# Patient Record
Sex: Male | Born: 1985 | Race: White | Hispanic: No | Marital: Married | Smoking: Never smoker
Health system: Southern US, Community
[De-identification: ages and names within clinical notes are randomized; demographics above are authoritative.]

## PROBLEM LIST (undated history)

## (undated) DIAGNOSIS — T7840XA Allergy, unspecified, initial encounter: Secondary | ICD-10-CM

## (undated) DIAGNOSIS — J45909 Unspecified asthma, uncomplicated: Secondary | ICD-10-CM

## (undated) HISTORY — DX: Unspecified asthma, uncomplicated: J45.909

## (undated) HISTORY — DX: Allergy, unspecified, initial encounter: T78.40XA

---
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
## 2013-08-06 ENCOUNTER — Ambulatory Visit (INDEPENDENT_AMBULATORY_CARE_PROVIDER_SITE_OTHER): Payer: BC Managed Care – PPO | Admitting: Physician Assistant

## 2013-08-06 VITALS — BP 128/84 | HR 65 | Temp 97.5°F | Resp 16 | Ht 70.0 in | Wt 240.0 lb

## 2013-08-06 DIAGNOSIS — D649 Anemia, unspecified: Secondary | ICD-10-CM

## 2013-08-06 DIAGNOSIS — Z0000 Encounter for general adult medical examination without abnormal findings: Secondary | ICD-10-CM

## 2013-08-06 DIAGNOSIS — J309 Allergic rhinitis, unspecified: Secondary | ICD-10-CM

## 2013-08-06 DIAGNOSIS — Z23 Encounter for immunization: Secondary | ICD-10-CM

## 2013-08-06 LAB — POCT CBC
GRANULOCYTE PERCENT: 49 % (ref 37–80)
HCT, POC: 40.1 % — AB (ref 43.5–53.7)
Hemoglobin: 13 g/dL — AB (ref 14.1–18.1)
Lymph, poc: 2.2 (ref 0.6–3.4)
MCH, POC: 30.3 pg (ref 27–31.2)
MCHC: 32.4 g/dL (ref 31.8–35.4)
MCV: 93.5 fL (ref 80–97)
MID (cbc): 0.4 (ref 0–0.9)
MPV: 9.1 fL (ref 0–99.8)
POC GRANULOCYTE: 2.4 (ref 2–6.9)
POC LYMPH %: 43.1 % (ref 10–50)
POC MID %: 7.9 %M (ref 0–12)
Platelet Count, POC: 218 10*3/uL (ref 142–424)
RBC: 4.29 M/uL — AB (ref 4.69–6.13)
RDW, POC: 12.9 %
WBC: 5 10*3/uL (ref 4.6–10.2)

## 2013-08-06 LAB — POCT URINALYSIS DIPSTICK
Bilirubin, UA: NEGATIVE
Glucose, UA: NEGATIVE
Ketones, UA: NEGATIVE
Leukocytes, UA: NEGATIVE
NITRITE UA: NEGATIVE
PROTEIN UA: NEGATIVE
Spec Grav, UA: <=1.005
UROBILINOGEN UA: 0.2
pH, UA: 6.5

## 2013-08-06 LAB — POCT UA - MICROSCOPIC ONLY
BACTERIA, U MICROSCOPIC: NEGATIVE
CASTS, UR, LPF, POC: NEGATIVE
Crystals, Ur, HPF, POC: NEGATIVE
Mucus, UA: NEGATIVE
WBC, Ur, HPF, POC: NEGATIVE
YEAST UA: NEGATIVE

## 2013-08-06 MED ORDER — FLUTICASONE PROPIONATE 50 MCG/ACT NA SUSP: 2.0000 | Freq: Every day | NASAL | Status: DC

## 2013-08-06 MED ORDER — ALBUTEROL SULFATE HFA 108 (90 BASE) MCG/ACT IN AERS: 2.0000 | INHALATION_SPRAY | RESPIRATORY_TRACT | Status: DC | PRN

## 2013-08-06 NOTE — Patient Instructions (Signed)
I will contact you with your lab results as soon as they are available.   If you have not heard from me in 2 weeks, please contact me.  The fastest way to get your results is to register for My Chart (see the instructions on the last page of this printout).  Keeping you healthy  Get these tests  Blood pressure- Have your blood pressure checked once a year by your healthcare provider.  Normal blood pressure is 120/80.  Weight- Have your body mass index (BMI) calculated to screen for obesity.  BMI is a measure of body fat based on height and weight. You can also calculate your own BMI at www.nhlbisupport.com/bmi/.  Cholesterol- Have your cholesterol checked regularly starting at age 35, sooner may be necessary if you have diabetes, high blood pressure, if a family member developed heart diseases at an early age or if you smoke.   Chlamydia, HIV, and other sexual transmitted disease- Get screened each year until the age of 25 then within three months of each new sexual partner.  Diabetes- Have your blood sugar checked regularly if you have high blood pressure, high cholesterol, a family history of diabetes or if you are overweight.  Get these vaccines  Flu shot- Every fall.  Tetanus shot- Every 10 years.  Menactra- Single dose; prevents meningitis.  Take these steps  Don't smoke- If you do smoke, ask your healthcare provider about quitting. For tips on how to quit, go to www.smokefree.gov or call 1-800-QUIT-NOW.  Be physically active- Exercise 5 days a week for at least 30 minutes.  If you are not already physically active start slow and gradually work up to 30 minutes of moderate physical activity.  Examples of moderate activity include walking briskly, mowing the yard, dancing, swimming bicycling, etc.  Eat a healthy diet- Eat a variety of healthy foods such as fruits, vegetables, low fat milk, low fat cheese, yogurt, lean meats, poultry, fish, beans, tofu, etc.  For more information  on healthy eating, go to www.thenutritionsource.org  Drink alcohol in moderation- Limit alcohol intake two drinks or less a day.  Never drink and drive.  Dentist- Brush and floss teeth twice daily; visit your dentis twice a year.  Depression-Your emotional health is as important as your physical health.  If you're feeling down, losing interest in things you normally enjoy please talk with your healthcare provider.  Gun Safety- If you keep a gun in your home, keep it unloaded and with the safety lock on.  Bullets should be stored separately.  Helmet use- Always wear a helmet when riding a motorcycle, bicycle, rollerblading or skateboarding.  Safe sex- If you may be exposed to a sexually transmitted infection, use a condom  Seat belts- Seat bels can save your life; always wear one.  Smoke/Carbon Monoxide detectors- These detectors need to be installed on the appropriate level of your home.  Replace batteries at least once a year.  Skin Cancer- When out in the sun, cover up and use sunscreen SPF 15 or higher.  Violence- If anyone is threatening or hurting you, please tell your healthcare provider. 
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
## 2013-08-07 LAB — COMPREHENSIVE METABOLIC PANEL
ALBUMIN: 5 g/dL (ref 3.5–5.2)
ALT: 24 U/L (ref 0–53)
AST: 20 U/L (ref 0–37)
Alkaline Phosphatase: 65 U/L (ref 39–117)
BILIRUBIN TOTAL: 0.5 mg/dL (ref 0.2–1.2)
BUN: 10 mg/dL (ref 6–23)
CO2: 27 mEq/L (ref 19–32)
Calcium: 9.7 mg/dL (ref 8.4–10.5)
Chloride: 104 mEq/L (ref 96–112)
Creat: 0.93 mg/dL (ref 0.50–1.35)
Glucose, Bld: 84 mg/dL (ref 70–99)
POTASSIUM: 3.9 meq/L (ref 3.5–5.3)
SODIUM: 139 meq/L (ref 135–145)
Total Protein: 7.3 g/dL (ref 6.0–8.3)

## 2013-08-07 LAB — LIPID PANEL
Cholesterol: 203 mg/dL — ABNORMAL HIGH (ref 0–200)
HDL: 37 mg/dL — AB (ref >39)
LDL CALC: 142 mg/dL — AB (ref 0–99)
Total CHOL/HDL Ratio: 5.5 Ratio
Triglycerides: 118 mg/dL (ref <150)
VLDL: 24 mg/dL (ref 0–40)

## 2013-08-07 LAB — IRON AND TIBC
%SAT: 15 % — ABNORMAL LOW (ref 20–55)
Iron: 66 ug/dL (ref 42–165)
TIBC: 438 ug/dL — AB (ref 215–435)
UIBC: 372 ug/dL (ref 125–400)

## 2013-08-07 LAB — FERRITIN: FERRITIN: 16 ng/mL — AB (ref 22–322)

## 2013-08-07 LAB — TSH: TSH: 1.666 u[IU]/mL (ref 0.350–4.500)

## 2013-08-13 ENCOUNTER — Encounter: Payer: Self-pay | Admitting: Physician Assistant

## 2013-12-31 ENCOUNTER — Encounter: Payer: BC Managed Care – PPO | Admitting: Family Medicine

## 2014-01-15 NOTE — Progress Notes (Signed)
This encounter was created in error - please disregard.

## 2015-11-02 ENCOUNTER — Encounter (HOSPITAL_COMMUNITY): Payer: Self-pay | Admitting: *Deleted

## 2015-11-02 ENCOUNTER — Emergency Department (HOSPITAL_COMMUNITY): Payer: 59

## 2015-11-02 ENCOUNTER — Emergency Department (HOSPITAL_COMMUNITY)
Admission: EM | Admit: 2015-11-02 | Discharge: 2015-11-02 | Disposition: A | Discharge status: Home / Self Care | Payer: 59 | Attending: Emergency Medicine | Admitting: Emergency Medicine

## 2015-11-02 DIAGNOSIS — Z79899 Other long term (current) drug therapy: Secondary | ICD-10-CM | POA: Diagnosis not present

## 2015-11-02 DIAGNOSIS — W450XXA Nail entering through skin, initial encounter: Secondary | ICD-10-CM | POA: Diagnosis not present

## 2015-11-02 DIAGNOSIS — M79675 Pain in left toe(s): Secondary | ICD-10-CM | POA: Diagnosis present

## 2015-11-02 DIAGNOSIS — M795 Residual foreign body in soft tissue: Secondary | ICD-10-CM

## 2015-11-02 DIAGNOSIS — S90452A Superficial foreign body, left great toe, initial encounter: Secondary | ICD-10-CM | POA: Diagnosis not present

## 2015-11-02 DIAGNOSIS — Y939 Activity, unspecified: Secondary | ICD-10-CM | POA: Insufficient documentation

## 2015-11-02 DIAGNOSIS — Y999 Unspecified external cause status: Secondary | ICD-10-CM | POA: Diagnosis not present

## 2015-11-02 DIAGNOSIS — J45909 Unspecified asthma, uncomplicated: Secondary | ICD-10-CM | POA: Insufficient documentation

## 2015-11-02 DIAGNOSIS — Y929 Unspecified place or not applicable: Secondary | ICD-10-CM | POA: Insufficient documentation

## 2015-11-02 MED ORDER — LIDOCAINE HCL 2 % IJ SOLN
10.0000 mL | Freq: Once | INTRAMUSCULAR | Status: AC
  Administered 2015-11-02: 200 mg via INTRADERMAL
  Filled 2015-11-02: qty 20

## 2015-11-02 MED ORDER — BACITRACIN ZINC 500 UNIT/GM EX OINT
1.0000 "application " | TOPICAL_OINTMENT | Freq: Once | CUTANEOUS | Status: AC
  Administered 2015-11-02: 1 via TOPICAL
  Filled 2015-11-02: qty 0.9

## 2015-11-02 MED ORDER — ONDANSETRON 4 MG PO TBDP
4.0000 mg | ORAL_TABLET | Freq: Once | ORAL | Status: AC
  Administered 2015-11-02: 4 mg via ORAL
  Filled 2015-11-02: qty 1

## 2015-11-02 MED ORDER — CEPHALEXIN 500 MG PO CAPS
1000.0000 mg | ORAL_CAPSULE | Freq: Once | ORAL | Status: AC
  Administered 2015-11-02: 1000 mg via ORAL
  Filled 2015-11-02: qty 2

## 2015-11-02 MED ORDER — CEPHALEXIN 500 MG PO CAPS: 500.0000 mg | ORAL_CAPSULE | Freq: Four times a day (QID) | ORAL | Status: DC

## 2015-11-02 MED ORDER — HYDROCODONE-ACETAMINOPHEN 5-325 MG PO TABS: ORAL_TABLET | ORAL | Status: AC

## 2015-11-02 NOTE — Discharge Instructions (Signed)
Wash the affected area with soap and water and apply a thin layer of topical antibiotic ointment. Do this every 12 hours.  ° °Do not use rubbing alcohol or hydrogen peroxide.                       ° °Look for signs of infection: if you see redness, if the area becomes warm, if pain increases sharply, there is discharge (pus), if red streaks appear or you develop fever or vomiting, RETURN immediately to the Emergency Department  for a recheck.  ° °
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
## 2015-11-02 NOTE — ED Notes (Signed)
Approximately 1 hour PTA patient stepped on a screw in his garage while barefoot.  Patient now presents with 3 inch exterior screw through the side of his left 4th toe.  Bleeding is controlled, Tdap last given 2015.

## 2015-11-02 NOTE — ED Notes (Signed)
Assisted with obtaining supplies for provider. Provider at bedside.

## 2016-08-18 ENCOUNTER — Ambulatory Visit (HOSPITAL_COMMUNITY)
Admission: EM | Admit: 2016-08-18 | Discharge: 2016-08-18 | Disposition: A | Discharge status: Home / Self Care | Payer: Worker's Compensation | Attending: Emergency Medicine | Admitting: Emergency Medicine

## 2016-08-18 ENCOUNTER — Encounter (HOSPITAL_COMMUNITY): Payer: Self-pay | Admitting: Emergency Medicine

## 2016-08-18 DIAGNOSIS — S0181XA Laceration without foreign body of other part of head, initial encounter: Secondary | ICD-10-CM

## 2016-08-18 MED ORDER — TRAMADOL HCL 50 MG PO TABS: 50.0000 mg | ORAL_TABLET | Freq: Four times a day (QID) | ORAL | 0 refills | Status: AC | PRN

## 2016-08-18 MED ORDER — BACITRACIN ZINC 500 UNIT/GM EX OINT
1.0000 "application " | TOPICAL_OINTMENT | Freq: Once | CUTANEOUS | Status: AC
  Administered 2016-08-18: 1 via TOPICAL

## 2016-08-18 MED ORDER — IBUPROFEN 600 MG PO TABS: 600.0000 mg | ORAL_TABLET | Freq: Four times a day (QID) | ORAL | 0 refills | Status: AC | PRN
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
## 2016-08-18 NOTE — ED Notes (Signed)
Stash moore, rad tech/phleb and this nurse discussed options for getting a drug screen tonight

## 2016-08-18 NOTE — Discharge Instructions (Signed)
ibuprofen 600 mg 1 g of Tylenol 4 times a day as needed for pain, cool compresses. Tramadol for severe pain only. Salt water gargles or blistering to help keep the mouth clean. Return to clinic in 5-7 days for suture removal, sooner for any signs of infection or concerns.   Below is a list of primary care practices who are taking new patients for you to follow-up with. [facility] [address] [place], [place] [address] [phone]  [facility] [phone] [address] [place] [address]  [facility]: [address] [place] [address]  [phone]  [facility]: [address] [place] [address]   [phone]  [facility]: [address] [place] [address]  [phone]  [facility] : [address] [place] [address] [phone]  [facility]: [address] [place] [address] [phone]  [facility]: [address] [place] [address] [phone]  Dr. Oneal Grout [address] [facility] [place] [address]  [phone]  Dr. Jackie Plum, [facility]. [address] [place], [place] [address]  [phone]
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
## 2017-09-07 IMAGING — CR DG TOE 4TH 2+V*L*
3 series · 3 of 3 positions shown · non-contrast
Comparison: 11/02/2015

CLINICAL DATA: Post removal of screw from left toe.

EXAM:
LEFT FOURTH TOE

[x toes ap left]
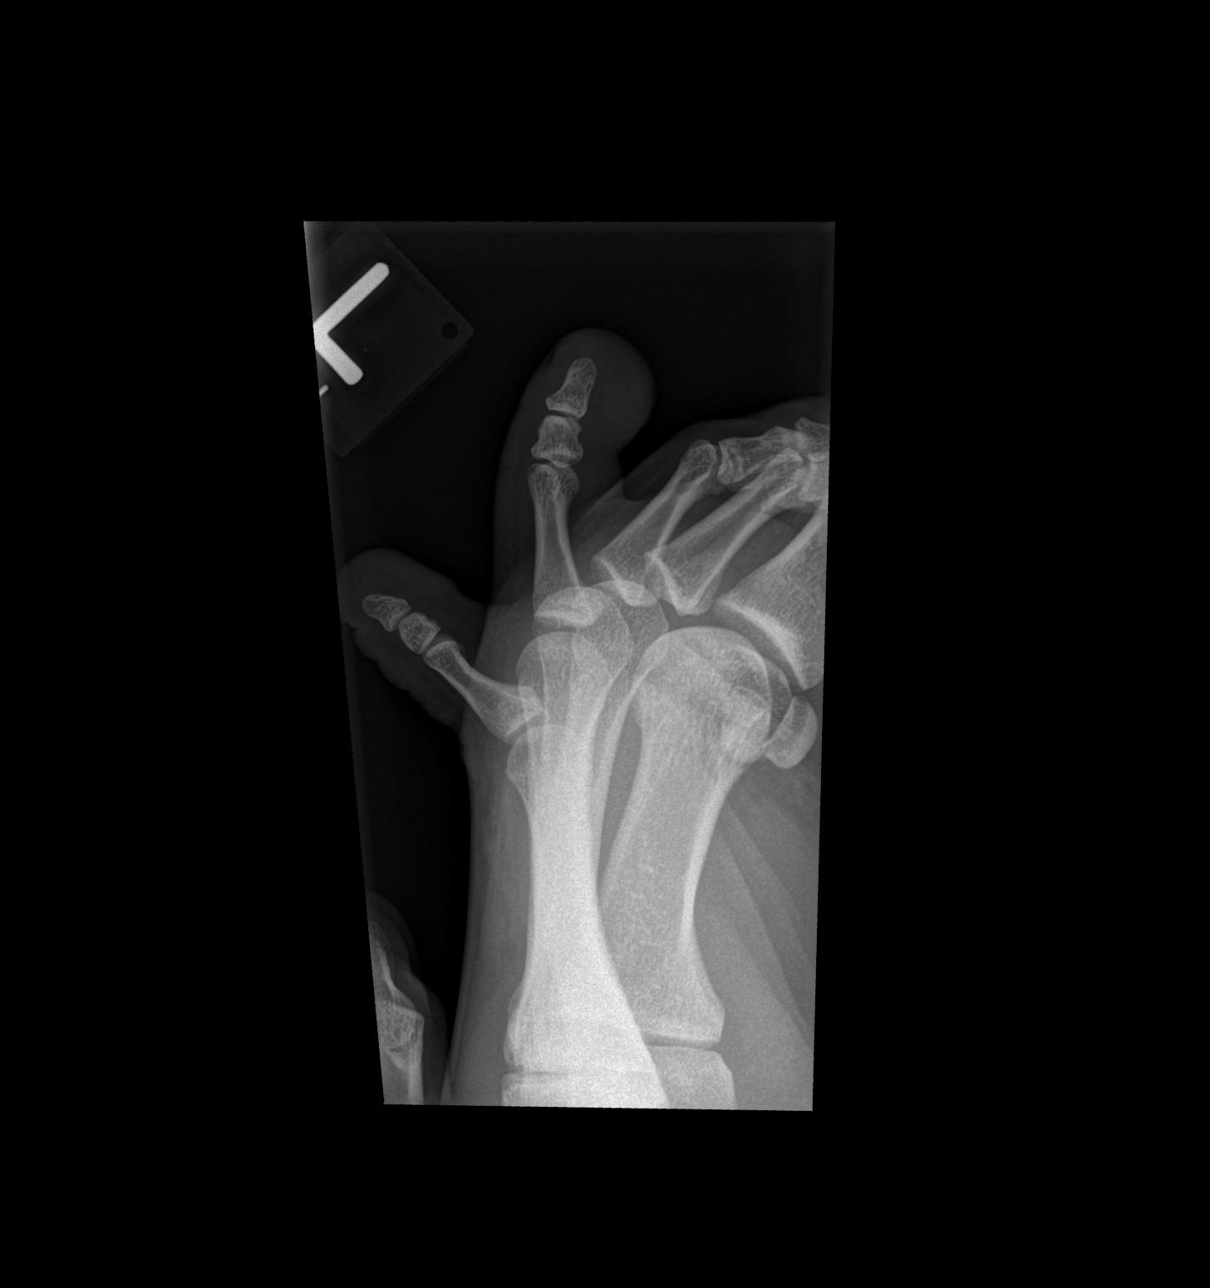

[x toes obl left]
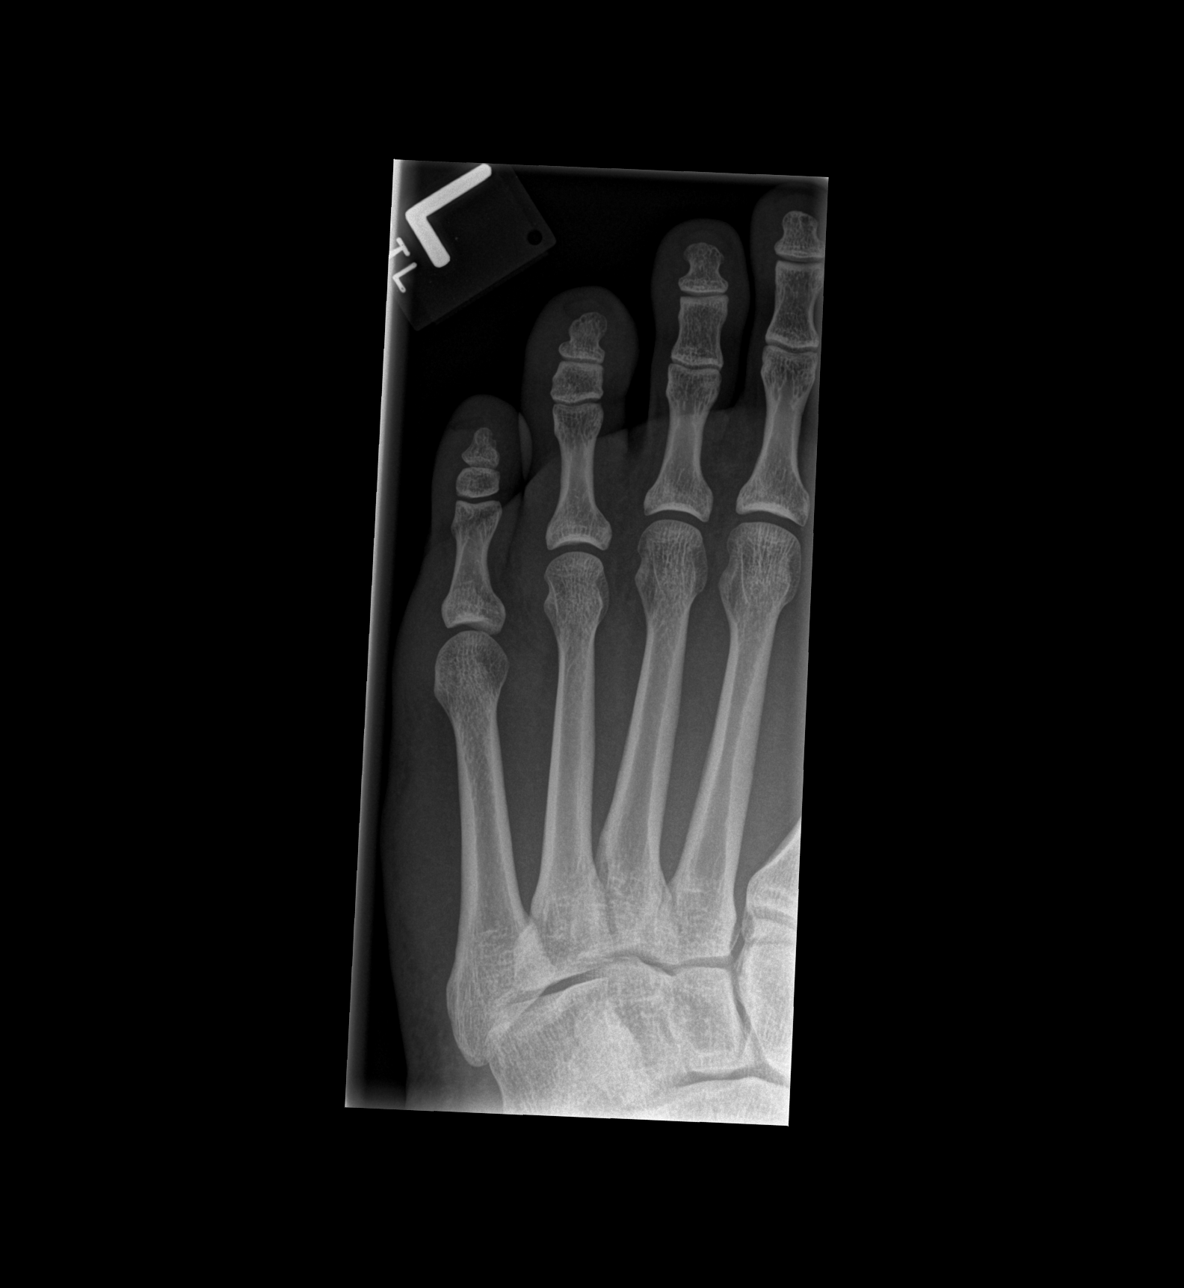

[x toes lat left]
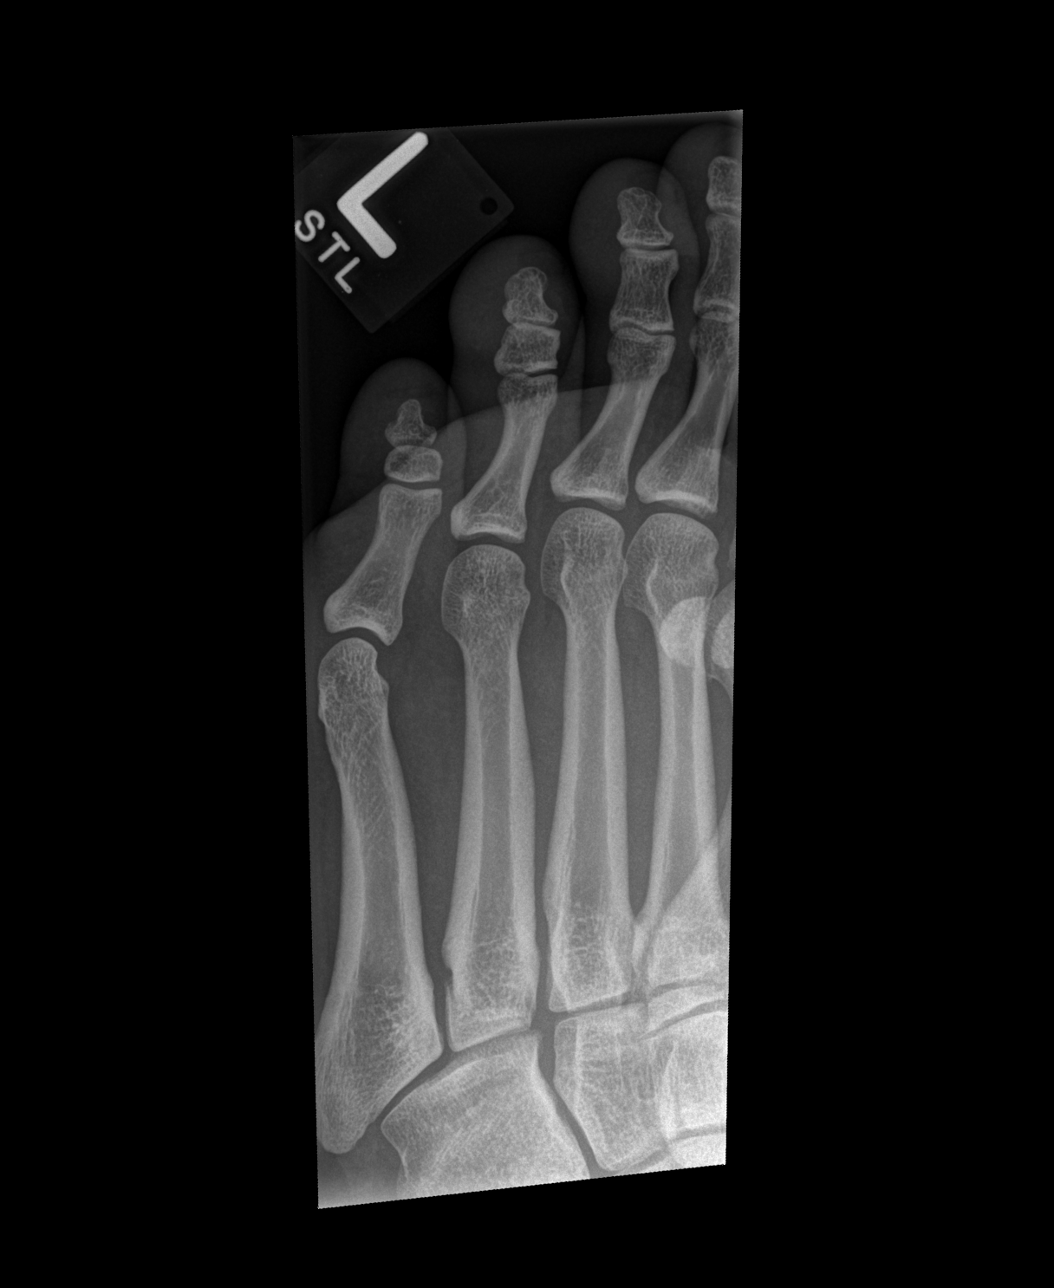

[3 of 3 positions shown; findings below may reference images not displayed]

FINDINGS: Examination demonstrates interval removal of metallic screw fragment
from between the third and fourth phalanges. Bones, joint spaces and
soft tissues otherwise unremarkable. No fracture, dislocation or
foreign body.
IMPRESSION: No acute findings. Interval removal of metallic screw from between
the third and fourth toes.

## 2018-11-09 DIAGNOSIS — R509 Fever, unspecified: Secondary | ICD-10-CM | POA: Diagnosis not present

## 2019-02-08 DIAGNOSIS — F4325 Adjustment disorder with mixed disturbance of emotions and conduct: Secondary | ICD-10-CM | POA: Diagnosis not present

## 2019-03-01 DIAGNOSIS — F4325 Adjustment disorder with mixed disturbance of emotions and conduct: Secondary | ICD-10-CM | POA: Diagnosis not present

## 2019-03-15 DIAGNOSIS — F4325 Adjustment disorder with mixed disturbance of emotions and conduct: Secondary | ICD-10-CM | POA: Diagnosis not present

## 2019-04-05 DIAGNOSIS — F4325 Adjustment disorder with mixed disturbance of emotions and conduct: Secondary | ICD-10-CM | POA: Diagnosis not present

## 2019-04-19 DIAGNOSIS — F4325 Adjustment disorder with mixed disturbance of emotions and conduct: Secondary | ICD-10-CM | POA: Diagnosis not present

## 2019-05-17 DIAGNOSIS — F4325 Adjustment disorder with mixed disturbance of emotions and conduct: Secondary | ICD-10-CM | POA: Diagnosis not present

## 2019-05-31 DIAGNOSIS — F4325 Adjustment disorder with mixed disturbance of emotions and conduct: Secondary | ICD-10-CM | POA: Diagnosis not present

## 2019-06-14 DIAGNOSIS — F4325 Adjustment disorder with mixed disturbance of emotions and conduct: Secondary | ICD-10-CM | POA: Diagnosis not present

## 2019-06-28 DIAGNOSIS — F4325 Adjustment disorder with mixed disturbance of emotions and conduct: Secondary | ICD-10-CM | POA: Diagnosis not present

## 2019-07-27 DIAGNOSIS — F4325 Adjustment disorder with mixed disturbance of emotions and conduct: Secondary | ICD-10-CM | POA: Diagnosis not present

## 2019-08-13 DIAGNOSIS — F4325 Adjustment disorder with mixed disturbance of emotions and conduct: Secondary | ICD-10-CM | POA: Diagnosis not present

## 2019-08-20 DIAGNOSIS — H1013 Acute atopic conjunctivitis, bilateral: Secondary | ICD-10-CM | POA: Diagnosis not present

## 2019-08-20 DIAGNOSIS — J4520 Mild intermittent asthma, uncomplicated: Secondary | ICD-10-CM | POA: Diagnosis not present

## 2019-08-20 DIAGNOSIS — J302 Other seasonal allergic rhinitis: Secondary | ICD-10-CM | POA: Diagnosis not present

## 2019-08-23 DIAGNOSIS — F4325 Adjustment disorder with mixed disturbance of emotions and conduct: Secondary | ICD-10-CM | POA: Diagnosis not present

## 2019-09-06 DIAGNOSIS — F4325 Adjustment disorder with mixed disturbance of emotions and conduct: Secondary | ICD-10-CM | POA: Diagnosis not present

## 2019-10-08 DIAGNOSIS — S53401A Unspecified sprain of right elbow, initial encounter: Secondary | ICD-10-CM | POA: Diagnosis not present

## 2019-10-08 DIAGNOSIS — M25531 Pain in right wrist: Secondary | ICD-10-CM | POA: Diagnosis not present

## 2019-10-08 DIAGNOSIS — Z4789 Encounter for other orthopedic aftercare: Secondary | ICD-10-CM | POA: Diagnosis not present

## 2019-10-08 DIAGNOSIS — M25521 Pain in right elbow: Secondary | ICD-10-CM | POA: Diagnosis not present

## 2019-10-08 DIAGNOSIS — M25529 Pain in unspecified elbow: Secondary | ICD-10-CM | POA: Diagnosis not present

## 2019-10-11 DIAGNOSIS — F4325 Adjustment disorder with mixed disturbance of emotions and conduct: Secondary | ICD-10-CM | POA: Diagnosis not present

## 2019-10-15 DIAGNOSIS — M25521 Pain in right elbow: Secondary | ICD-10-CM | POA: Diagnosis not present

## 2019-10-18 DIAGNOSIS — M25521 Pain in right elbow: Secondary | ICD-10-CM | POA: Diagnosis not present

## 2019-11-01 DIAGNOSIS — F4325 Adjustment disorder with mixed disturbance of emotions and conduct: Secondary | ICD-10-CM | POA: Diagnosis not present
